# Patient Record
Sex: Male | Born: 1940 | Race: Black or African American | Hispanic: No | Marital: Single | Smoking: Current every day smoker
Health system: Southern US, Community
[De-identification: ages and names within clinical notes are randomized; demographics above are authoritative.]

## PROBLEM LIST (undated history)

## (undated) DIAGNOSIS — C801 Malignant (primary) neoplasm, unspecified: Secondary | ICD-10-CM

## (undated) DIAGNOSIS — K219 Gastro-esophageal reflux disease without esophagitis: Secondary | ICD-10-CM

## (undated) HISTORY — PX: NEPHRECTOMY: SHX65

---

## 2013-11-16 ENCOUNTER — Emergency Department (HOSPITAL_BASED_OUTPATIENT_CLINIC_OR_DEPARTMENT_OTHER)
Admission: EM | Admit: 2013-11-16 | Discharge: 2013-11-17 | Disposition: A | Discharge status: Home / Self Care | Payer: Medicare HMO | Attending: Emergency Medicine | Admitting: Emergency Medicine

## 2013-11-16 ENCOUNTER — Encounter (HOSPITAL_BASED_OUTPATIENT_CLINIC_OR_DEPARTMENT_OTHER): Payer: Self-pay | Admitting: Emergency Medicine

## 2013-11-16 ENCOUNTER — Emergency Department (HOSPITAL_BASED_OUTPATIENT_CLINIC_OR_DEPARTMENT_OTHER): Payer: Medicare HMO

## 2013-11-16 DIAGNOSIS — R630 Anorexia: Secondary | ICD-10-CM | POA: Diagnosis not present

## 2013-11-16 DIAGNOSIS — F17200 Nicotine dependence, unspecified, uncomplicated: Secondary | ICD-10-CM | POA: Diagnosis not present

## 2013-11-16 DIAGNOSIS — R5383 Other fatigue: Principal | ICD-10-CM

## 2013-11-16 DIAGNOSIS — R3129 Other microscopic hematuria: Secondary | ICD-10-CM | POA: Diagnosis not present

## 2013-11-16 DIAGNOSIS — R066 Hiccough: Secondary | ICD-10-CM | POA: Diagnosis not present

## 2013-11-16 DIAGNOSIS — R531 Weakness: Secondary | ICD-10-CM

## 2013-11-16 DIAGNOSIS — R109 Unspecified abdominal pain: Secondary | ICD-10-CM | POA: Diagnosis not present

## 2013-11-16 DIAGNOSIS — R5381 Other malaise: Secondary | ICD-10-CM | POA: Insufficient documentation

## 2013-11-16 DIAGNOSIS — R300 Dysuria: Secondary | ICD-10-CM | POA: Diagnosis not present

## 2013-11-16 LAB — CBC WITH DIFFERENTIAL/PLATELET
Band Neutrophils: 0 % (ref 0–10)
Basophils Absolute: 0 K/uL (ref 0.0–0.1)
Basophils Relative: 0 % (ref 0–1)
Blasts: 0 %
Eosinophils Absolute: 0 K/uL (ref 0.0–0.7)
Eosinophils Relative: 0 % (ref 0–5)
HCT: 36.3 % — ABNORMAL LOW (ref 39.0–52.0)
Hemoglobin: 13.4 g/dL (ref 13.0–17.0)
Lymphocytes Relative: 52 % — ABNORMAL HIGH (ref 12–46)
Lymphs Abs: 1.9 K/uL (ref 0.7–4.0)
MCH: 36.7 pg — ABNORMAL HIGH (ref 26.0–34.0)
MCHC: 36.9 g/dL — ABNORMAL HIGH (ref 30.0–36.0)
MCV: 99.5 fL (ref 78.0–100.0)
Metamyelocytes Relative: 0 %
Monocytes Absolute: 0.2 K/uL (ref 0.1–1.0)
Monocytes Relative: 6 % (ref 3–12)
Myelocytes: 0 %
Neutro Abs: 1.5 K/uL — ABNORMAL LOW (ref 1.7–7.7)
Neutrophils Relative %: 42 % — ABNORMAL LOW (ref 43–77)
Platelets: 126 K/uL — ABNORMAL LOW (ref 150–400)
Promyelocytes Absolute: 0 %
RBC: 3.65 MIL/uL — ABNORMAL LOW (ref 4.22–5.81)
RDW: 14.4 % (ref 11.5–15.5)
WBC: 3.6 K/uL — ABNORMAL LOW (ref 4.0–10.5)
nRBC: 0 /100{WBCs}

## 2013-11-16 LAB — URINALYSIS, ROUTINE W REFLEX MICROSCOPIC
Bilirubin Urine: NEGATIVE
Glucose, UA: NEGATIVE mg/dL
Ketones, ur: NEGATIVE mg/dL
LEUKOCYTES UA: NEGATIVE
Nitrite: NEGATIVE
PROTEIN: 100 mg/dL — AB
Specific Gravity, Urine: 1.009 (ref 1.005–1.030)
UROBILINOGEN UA: 1 mg/dL (ref 0.0–1.0)
pH: 6 (ref 5.0–8.0)

## 2013-11-16 LAB — COMPREHENSIVE METABOLIC PANEL WITH GFR
ALT: 32 U/L (ref 0–53)
AST: 48 U/L — ABNORMAL HIGH (ref 0–37)
Albumin: 2.5 g/dL — ABNORMAL LOW (ref 3.5–5.2)
Alkaline Phosphatase: 60 U/L (ref 39–117)
Anion gap: 20 — ABNORMAL HIGH (ref 5–15)
BUN: 6 mg/dL (ref 6–23)
CO2: 24 meq/L (ref 19–32)
Calcium: 8.5 mg/dL (ref 8.4–10.5)
Chloride: 97 meq/L (ref 96–112)
Creatinine, Ser: 0.9 mg/dL (ref 0.50–1.35)
GFR calc Af Amer: >90 mL/min
GFR calc non Af Amer: 84 mL/min — ABNORMAL LOW
Glucose, Bld: 83 mg/dL (ref 70–99)
Potassium: 3.5 meq/L — ABNORMAL LOW (ref 3.7–5.3)
Sodium: 141 meq/L (ref 137–147)
Total Bilirubin: 0.5 mg/dL (ref 0.3–1.2)
Total Protein: 5.9 g/dL — ABNORMAL LOW (ref 6.0–8.3)

## 2013-11-16 LAB — URINE MICROSCOPIC-ADD ON

## 2013-11-16 LAB — LIPASE, BLOOD: Lipase: 34 U/L (ref 11–59)

## 2013-11-16 MED ORDER — SODIUM CHLORIDE 0.9 % IV BOLUS (SEPSIS)
1000.0000 mL | Freq: Once | INTRAVENOUS | Status: AC
  Administered 2013-11-16: 1000 mL via INTRAVENOUS

## 2013-11-16 MED ORDER — CHLORPROMAZINE HCL 25 MG PO TABS: 25.0000 mg | ORAL_TABLET | Freq: Three times a day (TID) | ORAL | Status: AC

## 2013-11-16 NOTE — ED Notes (Signed)
Pt reports hiccups and "not feeling well" x 3 days.  Reports decreased intake with no appetite.

## 2013-11-16 NOTE — Discharge Instructions (Signed)
°Emergency Department Resource Guide °1) Find a Doctor and Pay Out of Pocket °Although you won't have to find out who is covered by your insurance plan, it is a good idea to ask around and get recommendations. You will then need to call the office and see if the doctor you have chosen will accept you as a new patient and what types of options they offer for patients who are self-pay. Some doctors offer discounts or will set up payment plans for their patients who do not have insurance, but you will need to ask so you aren't surprised when you get to your appointment. ° °2) Contact Your Local Health Department °Not all health departments have doctors that can see patients for sick visits, but many do, so it is worth a call to see if yours does. If you don't know where your local health department is, you can check in your phone book. The CDC also has a tool to help you locate your state's health department, and many state websites also have listings of all of their local health departments. ° °3) Find a Walk-in Clinic °If your illness is not likely to be very severe or complicated, you may want to try a walk in clinic. These are popping up all over the country in pharmacies, drugstores, and shopping centers. They're usually staffed by nurse practitioners or physician assistants that have been trained to treat common illnesses and complaints. They're usually fairly quick and inexpensive. However, if you have serious medical issues or chronic medical problems, these are probably not your best option. ° °No Primary Care Doctor: °- Call [facility] at  832-8000 - they can help you locate a primary care doctor that  accepts your insurance, provides certain services, etc. °- Physician Referral Service- 1-800-533-3463 ° °Chronic Pain Problems: °Organization         Address  Phone   Notes  °[facility]  ([phone]) [phone] Patients need to be referred by their primary care doctor.  ° °Medication  Assistance: °Organization         Address  Phone   Notes  °[place] [facility] [address] °[place], [place] [address] (336) 641-8030 --Must be a resident of [place] °-- Must have NO insurance coverage whatsoever (no Medicaid/ Medicare, etc.) °-- The pt. MUST have a primary care doctor that directs their care regularly and follows them in the community °  °[facility]  (866) 331-1348   °[employer]  (888) 892-1162   ° °Agencies that provide inexpensive medical care: °Organization         Address  Phone   Notes  °[facility]  (336) 832-8035   °[facility]    (336) 832-7272   °[facility] [address] °[place], [place] [address] (336) 832-4777   °[facility] [address], °[place] (336) 271-4999   °[facility]    (336) 373-0678   °[facility]    (336) 272-1050   °[facility] ° [address], [place] Phone:  (336) 832-4444, Fax:  (336) 832-4440 Hours of Operation:  9 am - 6 pm, M-F.  Also accepts Medicaid/Medicare and self-pay.  °[facility] ° [address],  Phone: (336) 832-3150, Fax: (336) [phone]. Hours of Operation:  8:30 am - 5:30 pm, M-F.  Also accepts Medicaid and self-pay.  °[facility] [place] [address], [place] Phone: (336) 878-6027   °[facility] [address], [place], [place] (336)723-1848, Ext. 123 Mondays & Thursdays: 7-9 AM.  First 15 patients are seen on a first come, first serve basis. °  ° °Medicaid-accepting [place] Providers: ° °Organization         Address  Phone   Notes  °[facility] [address], [place] (336) 641-2100 Also accepts self-pay patients.  °[facility] [address], [place] ° (336) 856-9996   °[facility] [address], [place]  (336) 288-8857   °[facility] [address] [place] [address], [place] (336) 299-7000   °Veita Bland [address], [place]  ° (336) 373-1557 Only accepts [place] Access Medicaid patients after they have their name applied to their card.  ° °Self-Pay (no insurance) in [place]: ° °Organization         Address  Phone   Notes  °Sickle Cell Patients, [facility] [address], [place] (336) 832-1970   °[facility] [address], [place] (336) 832-4400   °[facility] ° [address], [place] (336) 992-4800   °[facility]/Dr. Osei-Bonsu ° [address] [place] [address], [place] or [address], [place] (336) [phone] Phone number for both [place] and [place] locations is the same.  °[facility] [address], [place] (336) 299-0000   °[facility] [address] [place] [address], [place] or [address] (336) 852-7530 °(336) 878-2260   °[facility] [address], [place] (336) 350-1642, phone; (336) 294-5005, fax Sees patients 1st and 3rd Saturday of every month.  Must not qualify for public or private insurance (i.e. Medicaid, Medicare, [place] Health Choice, Veterans' Benefits) • Household income should be no more than 200% of the poverty level •The clinic cannot treat you if you are pregnant or think you are pregnant • Sexually transmitted diseases are not treated at the clinic.  ° ° °Dental Care: °Organization         Address  Phone  Notes  °[place] [facility] [address], [place] (336) 641-6152 Accepts children up to age 21 who are enrolled in Medicaid or [place] Health Choice; pregnant women with a Medicaid card; and children who have applied for Medicaid or [place] Health Choice, but were declined, whose parents can pay a reduced fee at time of service.  °[place]  [facility] [place]  [address], [place] (336) 641-7733 Accepts children up to age 21 who are enrolled in Medicaid or [place] Health Choice; pregnant women with a Medicaid card; and children who have applied for Medicaid or  Health Choice, but were declined, whose parents can pay a reduced fee at time of service.  °[facility] ° [address],  (336) 641-4533 Patients are seen by appointment only. Walk-ins are not accepted. [facility] will see patients 18 years of age and older. °Monday - Tuesday (8am-5pm) °Most Wednesdays (8:30-5pm) °$30 per visit, cash only  °[facility] ° [address], [place] (336) 641-4533 Patients are seen by appointment only. Walk-ins are not accepted. [facility] will see patients 18 years of age and older. °One   Wednesday Evening (Monthly: Volunteer Based).  $30 per visit, cash only  °[facility]  (919) [phone] for adults; Children under age 4, call Graduate Pediatric Dentistry at ([phone]) [phone]. Children aged 4-14, please call ([phone]) [phone] to request a pediatric application. ° Dental services are provided in all areas of dental care including fillings, crowns and bridges, complete and partial dentures, implants, gum treatment, root canals, and extractions. Preventive care is also provided. Treatment is provided to both adults and children. °Patients are selected via a lottery and there is often a waiting list. °  °[facility] [address], °[place] ° (336) 763-8833 [email or web address] °  °[facility] [address], [place], [place] (336)723-1848, Ext. 123 Second and Fourth Thursday of each month, opens at 6:30 AM; Clinic ends at 9 AM.  Patients are seen on a first-come first-served basis, and a limited number are seen during each clinic.  ° °[facility] ° [address], [place], [place] (336) [phone]    Eligibility Requirements °You must have lived in [place], [place], or [place] counties for at least the last three months. °  You cannot be eligible for state or federal sponsored healthcare insurance, including Veterans Administration, Medicaid, or Medicare. °  You generally cannot be eligible for healthcare insurance through your employer.  °  How to apply: °Eligibility screenings are held every Tuesday and Wednesday afternoon from 1:00 pm until 4:00 pm. You do not need an appointment for the interview!  °[facility] [address], [place], [place] 336-631-2330   °[facility]  336-342-8273   °[facility]  336-703-3100   °[facility]  336-570-6415   ° °Behavioral Health Resources in the Community: °Intensive Outpatient Programs °Organization         Address  Phone  Notes  °[facility] [address], [place], [place] 336-878-6098   °[facility] [address], [place], [place] 336-832-9800   °[facility] [address], [place], [place] ° 336-882-2125   °[facility] [address],  °[place], [place] 1-800-853-5163 or 336-641-4981   °Substance Abuse Resources °Organization         Address  Phone  Notes  °[facility]  336-882-2125   °[facility]  336-784-9470   °[facility]  336-285-9073   °[facility]  336-845-3988   °Residential & Outpatient Substance Abuse Program  1-800-659-3381   °Psychological Services °Organization         Address  Phone  Notes  °[facility]  336- 832-9600   °[facility]  336- 378-7881   °[facility] [address], [place] 1-800-853-5163 or 336-641-4981   ° °Mobile Crisis Teams °Organization         Address  Phone  Notes  °[facility], Mobile Crisis Care Unit  1-877-626-1772   °Assertive °[facility] ° [address]  [place], [place] 336-834-9664   °Sharon DeEsch [address] °[place] 336-554-5454   ° °Self-Help/Support Groups °Organization         Address  Phone             Notes  °[facility] - variety of support groups  336- 373-1402 Call for more information  °Narcotics Anonymous (NA), [facility] [address], °[place]  2 meetings at this location  ° °  Residential Treatment Programs Organization         Address  Phone  Notes  [facility] [address]    [place]  1-(609)713-4130   [facility]  [address], [place] [address], [place], [place]   [place], [place] 407-800-1302 Admissions: 8am-3pm M-F  [facility] [place] [address]    [place], [place] 086-761-9509   [facility] [address] [place], [place], [place]   [facility] [address],  [place], [place]   [facility] - [address]ensive [address], [place], [place]   [facility] ([place]  [place], [place] 1-(713)040-8947 or (217) 079-0746   [facility] [address] [place], [place] Accepts Medicaid  [facility] [address],  [place] 1-(920) 803-0259 Substance Abuse/Addiction Treatment   [facility]         Address  Phone  Notes  [facility]  940-677-3924   Domenic Schwab, PhD [address] Arlis Porta [place], [place]   301-761-5170 or 825-870-9171   [place] [place], [place] [phone]   [facility] [address] [place], [place] (670)[phone] Insurance/Medicaid/sponsorship through [facility] and Families [address]                                    [place], [place] [phone] [place] [address], [place] ([phone]) [phone]    Dr. Adele Schilder  [phone]   [facility] 1) [address] [place] 2) [place] 3)  [place] [address], [place] [phone]  (240)[phone]   [place] 740-191-7170 or (865) [phone] (After Hours)         Weakness Weakness is a lack of strength. It may be felt all over the body (generalized) or in one specific part of the body (focal). Some causes of weakness can be serious. You may need further medical evaluation, especially if you are elderly or you have a history of immunosuppression (such as chemotherapy or HIV), kidney disease, heart disease, or diabetes. CAUSES  Weakness can be caused by many different things, including:  Infection.  Physical exhaustion.  Internal bleeding or other blood loss that results in a lack of red blood cells (anemia).  Dehydration. This cause is more common in elderly people.  Side effects or electrolyte abnormalities from medicines, such as pain medicines or sedatives.  Emotional distress, anxiety, or depression.  Circulation problems, especially severe peripheral arterial disease.  Heart disease, such as rapid atrial fibrillation, bradycardia, or heart failure.  Nervous system disorders, such as Guillain-Barr syndrome, multiple sclerosis, or stroke. DIAGNOSIS  To find the cause of your weakness, your caregiver will take your history and perform a physical exam. Lab tests or X-rays may also be ordered, if needed. TREATMENT  Treatment of weakness depends on the cause of your symptoms and can vary greatly. HOME CARE INSTRUCTIONS   Rest as needed.  Eat a well-balanced diet.  Try to get some exercise every day.  Only take over-the-counter or prescription  medicines as directed by your caregiver. SEEK MEDICAL CARE IF:   Your weakness seems to be  getting worse or spreads to other parts of your body.  You develop new aches or pains. SEEK IMMEDIATE MEDICAL CARE IF:   You cannot perform your normal daily activities, such as getting dressed and feeding yourself.  You cannot walk up and down stairs, or you feel exhausted when you do so.  You have shortness of breath or chest pain.  You have difficulty moving parts of your body.  You have weakness in only one area of the body or on only one side of the body.  You have a fever.  You have trouble speaking or swallowing.  You cannot control your bladder or bowel movements.  You have black or bloody vomit or stools. MAKE SURE YOU:  Understand these instructions.  Will watch your condition.  Will get help right away if you are not doing well or get worse. Document Released: 02/27/2005 Document Revised: 08/29/2011 Document Reviewed: 04/28/2011 [facility] Patient Information 2015 [place], [place]. This information is not intended to replace advice given to you by your health care provider. Make sure you discuss any questions you have with your health care provider.    Fatigue Fatigue is a feeling of tiredness, lack of energy, lack of motivation, or feeling tired all the time. Having enough rest, good nutrition, and reducing stress will normally reduce fatigue. Consult your caregiver if it persists. The nature of your fatigue will help your caregiver to find out its cause. The treatment is based on the cause.  CAUSES  There are many causes for fatigue. Most of the time, fatigue can be traced to one or more of your habits or routines. Most causes fit into one or more of three general areas. They are: Lifestyle problems  Sleep disturbances.  Overwork.  Physical exertion.  Unhealthy habits.  Poor eating habits or eating disorders.  Alcohol and/or drug use .  Lack of proper nutrition (malnutrition). Psychological problems  Stress and/or anxiety  problems.  Depression.  Grief.  Boredom. Medical Problems or Conditions  Anemia.  Pregnancy.  Thyroid gland problems.  Recovery from major surgery.  Continuous pain.  Emphysema or asthma that is not well controlled  Allergic conditions.  Diabetes.  Infections (such as mononucleosis).  Obesity.  Sleep disorders, such as sleep apnea.  Heart failure or other heart-related problems.  Cancer.  Kidney disease.  Liver disease.  Effects of certain medicines such as antihistamines, cough and cold remedies, prescription pain medicines, heart and blood pressure medicines, drugs used for treatment of cancer, and some antidepressants. SYMPTOMS  The symptoms of fatigue include:   Lack of energy.  Lack of drive (motivation).  Drowsiness.  Feeling of indifference to the surroundings. DIAGNOSIS  The details of how you feel help guide your caregiver in finding out what is causing the fatigue. You will be asked about your present and past health condition. It is important to review all medicines that you take, including prescription and non-prescription items. A thorough exam will be done. You will be questioned about your feelings, habits, and normal lifestyle. Your caregiver may suggest blood tests, urine tests, or other tests to look for common medical causes of fatigue.  TREATMENT  Fatigue is treated by correcting the underlying cause. For example, if you have continuous pain or depression, treating these causes will improve how you feel. Similarly, adjusting the dose of certain medicines will help in reducing fatigue.  HOME CARE INSTRUCTIONS  Try to get the required amount of good sleep every night.  Eat a healthy and nutritious diet, and drink enough water throughout the day.  Practice ways of relaxing (including yoga or meditation).  Exercise regularly.  Make plans to change situations that cause stress. Act on those plans so that stresses decrease over time. Keep  your work and personal routine reasonable.  Avoid street drugs and minimize use of alcohol.  Start taking a daily multivitamin after consulting your caregiver. SEEK MEDICAL CARE IF:   You have persistent tiredness, which cannot be accounted for.  You have fever.  You have unintentional weight loss.  You have headaches.  You have disturbed sleep throughout the night.  You are feeling sad.  You have constipation.  You have dry skin.  You have gained weight.  You are taking any new or different medicines that you suspect are causing fatigue.  You are unable to sleep at night.  You develop any unusual swelling of your legs or other parts of your body. SEEK IMMEDIATE MEDICAL CARE IF:   You are feeling confused.  Your vision is blurred.  You feel faint or pass out.  You develop severe headache.  You develop severe abdominal, pelvic, or back pain.  You develop chest pain, shortness of breath, or an irregular or fast heartbeat.  You are unable to pass a normal amount of urine.  You develop abnormal bleeding such as bleeding from the rectum or you vomit blood.  You have thoughts about harming yourself or committing suicide.  You are worried that you might harm someone else. MAKE SURE YOU:   Understand these instructions.  Will watch your condition.  Will get help right away if you are not doing well or get worse. Document Released: 12/25/2006 Document Revised: 05/22/2011 Document Reviewed: 07/01/2013 [facility] Patient Information 2015 [place], [place]. This information is not intended to replace advice given to you by your health care provider. Make sure you discuss any questions you have with your health care provider.   Hematuria Hematuria is blood in your urine. It can be caused by a bladder infection, kidney infection, prostate infection, kidney stone, or cancer of your urinary tract. Infections can usually be treated with medicine, and a kidney stone usually  will pass through your urine. If neither of these is the cause of your hematuria, further workup to find out the reason may be needed. It is very important that you tell your health care provider about any blood you see in your urine, even if the blood stops without treatment or happens without causing pain. Blood in your urine that happens and then stops and then happens again can be a symptom of a very serious condition. Also, pain is not a symptom in the initial stages of many urinary cancers. HOME CARE INSTRUCTIONS   Drink lots of fluid, 3-4 quarts a day. If you have been diagnosed with an infection, cranberry juice is especially recommended, in addition to large amounts of water.  Avoid caffeine, tea, and carbonated beverages because they tend to irritate the bladder.  Avoid alcohol because it may irritate the prostate.  Take all medicines as directed by your health care provider.  If you were prescribed an antibiotic medicine, finish it all even if you start to feel better.  If you have been diagnosed with a kidney stone, follow your health care provider's instructions regarding straining your urine to catch the stone.  Empty your bladder often. Avoid holding urine for long periods  of time.  After a bowel movement, women should cleanse front to back. Use each tissue only once.  Empty your bladder before and after sexual intercourse if you are a male. SEEK MEDICAL CARE IF:  You develop back pain.  You have a fever.  You have a feeling of sickness in your stomach (nausea) or vomiting.  Your symptoms are not better in 3 days. Return sooner if you are getting worse. SEEK IMMEDIATE MEDICAL CARE IF:   You develop severe vomiting and are unable to keep the medicine down.  You develop severe back or abdominal pain despite taking your medicines.  You begin passing a large amount of blood or clots in your urine.  You feel extremely weak or faint, or you pass out. MAKE SURE YOU:    Understand these instructions.  Will watch your condition.  Will get help right away if you are not doing well or get worse. Document Released: 02/27/2005 Document Revised: 07/14/2013 Document Reviewed: 10/28/2012 [facility] Patient Information 2015 [place], [place]. This information is not intended to replace advice given to you by your health care provider. Make sure you discuss any questions you have with your health care provider.    Hiccups A hiccup is the result of a sudden shortening of the muscle below your lungs (diaphragm). This movement of your diaphragm is then followed by the closing of your vocal cords, which causes the hiccup sound. Most people get the hiccups. Typically, hiccups last only a short amount of time. There are three types of hiccups:   Benign: last less than 48 hours.  Persistent: last more than 48 hours, but less than 1 month.  Intractable: last more than 1 month. A hiccup is a reflex. You cannot control reflexes. CAUSES  Causes of the hiccups can include:   Eating too much.  Drinking too much alcohol or fizzy drinks.  Eating too fast.  Eating or drinking hot and spicy foods or drinks.  Using certain medicines that have hiccupping as a side effect. Several medical conditions may also cause hiccups, including, but not limited to:  Stroke.  Gastroesophageal reflux.  Multiple sclerosis.  Traumatic brain injury.  Brain tumor.  Meningitis.  Having damage to the nerve that affects the diaphragm. Usually, though, hiccups have no apparent cause and are not the result of a serious medical condition. DIAGNOSIS  Tests may be performed to diagnose a possible condition associated with persistent or intractable hiccups. TREATMENT  Most cases of the hiccups need no treatment. None of the numerous home remedies have been proven to be effective. If your hiccups do require treatment, your treatment may include:  Medicine. Medicine may be given  intravenously (by IV) or by mouth.  Hypnosis or acupuncture.  Surgery to the nerve that affects the diaphragm may be tried in severe cases. If your hiccups are caused by an underlying medical condition, treatment for the medical condition may be necessary.  HOME CARE INSTRUCTIONS   Eat small meals.  Limit alcohol intake to no more than 1 drink per day for nonpregnant women and 2 drinks per day for men. One drink equals 12 ounces of beer, 5 ounces of wine, or 1 ounces of hard liquor.  Limit drinking fizzy drinks.  Eat and chew your food slowly.  Take medicines only as directed by your health care provider. SEEK MEDICAL CARE IF:   Your hiccups last for more than 48 hours.  You are given medicine, but your hiccups do not get better.  You  cannot sleep or eat due to the hiccups.  You have unexpected weight loss due to the hiccups.  You have trouble breathing or swallowing.  You have a fever.  You develop severe pain in your abdomen.  You develop numbness, tingling, or weakness. Document Released: 05/08/2001 Document Revised: 07/14/2013 Document Reviewed: 04/20/2010 [facility] Patient Information 2015 [place], [place]. This information is not intended to replace advice given to you by your health care provider. Make sure you discuss any questions you have with your health care provider.

## 2013-11-16 NOTE — ED Provider Notes (Signed)
CSN: [id number]     Arrival date & time 11/16/13  1943 History  This chart was scribed for Ephraim Hamburger, MD by Cathie Hoops, ED Scribe. The patient was seen in [place]. The patient's care was started at 9:15 PM.     Chief Complaint  Patient presents with  . Fatigue   The history is provided by the patient. No language interpreter was used.   HPI Comments: Ricardo Harris is a 73 y.o. male who presents to the Emergency Department complaining of new, constant, gradually worsening fatigue. Pt has associated hiccups, lower abdominal pain, dysuria and vomiting. Pt states his appetite has decreased for the past 2-3 days. Pt has had abdominal pain for 3 days. Pt has thrown up 2x today. His dysuria has occurred for 2-3 weeks. Pt denies he has seen anyone for his dysuria. Pt notes his abdominal pain occurs when his urinates. Pt has tried drinking water and holding his breathe without relief. Pt denies gas, back pain, chest pain, diarrhea, cough, SOB, penile pain, testicular pain and hematuria.  History reviewed. No pertinent past medical history. History reviewed. No pertinent past surgical history. No family history on file. History  Substance Use Topics  . Smoking status: Current Every Day Smoker -- 1.00 packs/day    Types: Cigarettes  . Smokeless tobacco: Not on file  . Alcohol Use: Yes    Review of Systems  Constitutional: Positive for appetite change and fatigue.  Respiratory: Negative for cough and shortness of breath.   Gastrointestinal: Positive for abdominal pain. Negative for diarrhea and blood in stool.  Genitourinary: Positive for dysuria. Negative for hematuria, penile pain and testicular pain.  Musculoskeletal: Negative for back pain.  All other systems reviewed and are negative.  Allergies  Review of patient's allergies indicates no known allergies.  Home Medications   Prior to Admission medications   Not on File   Triage Vitals: BP 128/76  Pulse 88  Temp(Src) 98.8  F (37.1 C) (Oral)  Resp 18  Ht 5\' 4"  (1.626 m)  Wt 130 lb (58.968 kg)  BMI 22.30 kg/m2  SpO2 97% Physical Exam  Nursing note and vitals reviewed. Constitutional: He is oriented to person, place, and time. He appears well-developed and well-nourished. No distress.  Intermittent hiccupping.  HENT:  Head: Normocephalic and atraumatic.  Right Ear: External ear normal.  Left Ear: External ear normal.  Nose: Nose normal.  Mouth/Throat: Oropharynx is clear and moist.  Neck: Neck supple. No tracheal deviation present.  Cardiovascular: Normal rate, regular rhythm and normal heart sounds.   Pulmonary/Chest: Effort normal and breath sounds normal. No respiratory distress.  Abdominal: Soft. He exhibits no distension. There is no tenderness.  No CVA tenderness  Neurological: He is alert and oriented to person, place, and time.  Skin: Skin is warm and dry. No pallor.  Psychiatric: He has a normal mood and affect. His behavior is normal.    ED Course  Procedures (including critical care time) DIAGNOSTIC STUDIES: Oxygen Saturation is 97% on RA, adqueate by my interpretation.    COORDINATION OF CARE: 9:21 PM- Patient informed of current plan for treatment and evaluation and agrees with plan at this time.    Labs Review Labs Reviewed  URINALYSIS, ROUTINE W REFLEX MICROSCOPIC - Abnormal; Notable for the following:    Color, Urine AMBER (*)    APPearance CLOUDY (*)    Hgb urine dipstick LARGE (*)    Protein, ur 100 (*)    All other components within normal  limits  CBC WITH DIFFERENTIAL - Abnormal; Notable for the following:    WBC 3.6 (*)    RBC 3.65 (*)    HCT 36.3 (*)    MCH 36.7 (*)    MCHC 36.9 (*)    Platelets 126 (*)    Neutrophils Relative % 42 (*)    Lymphocytes Relative 52 (*)    Neutro Abs 1.5 (*)    All other components within normal limits  COMPREHENSIVE METABOLIC PANEL - Abnormal; Notable for the following:    Potassium 3.5 (*)    Total Protein 5.9 (*)    Albumin  2.5 (*)    AST 48 (*)    GFR calc non Af Amer 84 (*)    Anion gap 20 (*)    All other components within normal limits  URINE MICROSCOPIC-ADD ON  LIPASE, BLOOD    Imaging Review Dg Chest 2 View 11/16/2013   CLINICAL DATA:  Worsening fatigue. Lower abdominal pain, dysuria, and vomiting. Decreased appetite. Vomiting.  EXAM: CHEST  2 VIEW  COMPARISON:  11/05/2013  FINDINGS: Normal heart size and pulmonary vascularity. Slight interstitial fibrosis. No focal airspace disease or consolidation in the lungs. No blunting of costophrenic angles. No pneumothorax.  IMPRESSION: No active cardiopulmonary disease.   Electronically Signed   By: Lucienne Capers M.D.   On: 11/16/2013 21:45    EKG Interpretation Date/Time:  Sunday November 16 2013 21:54:51 EDT Ventricular Rate:  82 PR Interval:    QRS Duration: 70 QT Interval:  366 QTC Calculation: 427 R Axis:   -21 Text Interpretation:  uncertain rhythm given artifact, rhythm appears  regular no ST/T changes No old tracing to compare Confirmed by [place] ([id number]) on 11/16/2013 9:55:29 PM      MDM   Final diagnoses:  Weakness  Microscopic hematuria  Intractable hiccups    Patient appears well currently, has no abdominal tenderness or distention. Has dysuria but no UTI. Has microscopic hematuria that appears new. Workup is otherwise unremarkable for cause of fatigue. Given he has no current abd pain or tenderness I feel imaging is not needed. Will recommend close f/u with PCP. Will give urology f/u for micrscopic hematuria. Given thorazine for hiccups.  This chart was scribed in my presence and reviewed by me personally.   Ephraim Hamburger, MD 11/17/13 [phone]

## 2014-01-26 ENCOUNTER — Emergency Department (HOSPITAL_BASED_OUTPATIENT_CLINIC_OR_DEPARTMENT_OTHER): Payer: Medicare HMO

## 2014-01-26 ENCOUNTER — Encounter (HOSPITAL_BASED_OUTPATIENT_CLINIC_OR_DEPARTMENT_OTHER): Payer: Self-pay

## 2014-01-26 ENCOUNTER — Emergency Department (HOSPITAL_BASED_OUTPATIENT_CLINIC_OR_DEPARTMENT_OTHER)
Admission: EM | Admit: 2014-01-26 | Discharge: 2014-01-26 | Disposition: A | Discharge status: Home / Self Care | Payer: Medicare HMO | Attending: Emergency Medicine | Admitting: Emergency Medicine

## 2014-01-26 DIAGNOSIS — Z79899 Other long term (current) drug therapy: Secondary | ICD-10-CM | POA: Insufficient documentation

## 2014-01-26 DIAGNOSIS — Z792 Long term (current) use of antibiotics: Secondary | ICD-10-CM | POA: Diagnosis not present

## 2014-01-26 DIAGNOSIS — R1012 Left upper quadrant pain: Secondary | ICD-10-CM | POA: Insufficient documentation

## 2014-01-26 DIAGNOSIS — R1013 Epigastric pain: Secondary | ICD-10-CM | POA: Insufficient documentation

## 2014-01-26 DIAGNOSIS — R109 Unspecified abdominal pain: Secondary | ICD-10-CM

## 2014-01-26 DIAGNOSIS — R748 Abnormal levels of other serum enzymes: Secondary | ICD-10-CM | POA: Diagnosis not present

## 2014-01-26 DIAGNOSIS — Z720 Tobacco use: Secondary | ICD-10-CM | POA: Diagnosis not present

## 2014-01-26 DIAGNOSIS — Z905 Acquired absence of kidney: Secondary | ICD-10-CM | POA: Insufficient documentation

## 2014-01-26 DIAGNOSIS — K5900 Constipation, unspecified: Secondary | ICD-10-CM

## 2014-01-26 LAB — URINALYSIS, ROUTINE W REFLEX MICROSCOPIC
BILIRUBIN URINE: NEGATIVE
GLUCOSE, UA: NEGATIVE mg/dL
HGB URINE DIPSTICK: NEGATIVE
KETONES UR: NEGATIVE mg/dL
Leukocytes, UA: NEGATIVE
Nitrite: NEGATIVE
PROTEIN: 30 mg/dL — AB
Specific Gravity, Urine: 1.016 (ref 1.005–1.030)
Urobilinogen, UA: 0.2 mg/dL (ref 0.0–1.0)
pH: 6 (ref 5.0–8.0)

## 2014-01-26 LAB — COMPREHENSIVE METABOLIC PANEL
ALK PHOS: 73 U/L (ref 39–117)
ALT: 8 U/L (ref 0–53)
ANION GAP: 14 (ref 5–15)
AST: 15 U/L (ref 0–37)
Albumin: 2.8 g/dL — ABNORMAL LOW (ref 3.5–5.2)
BUN: 14 mg/dL (ref 6–23)
CHLORIDE: 98 meq/L (ref 96–112)
CO2: 26 mEq/L (ref 19–32)
Calcium: 9.6 mg/dL (ref 8.4–10.5)
Creatinine, Ser: 1.5 mg/dL — ABNORMAL HIGH (ref 0.50–1.35)
GFR, EST AFRICAN AMERICAN: 52 mL/min — AB (ref >90)
GFR, EST NON AFRICAN AMERICAN: 45 mL/min — AB (ref >90)
GLUCOSE: 146 mg/dL — AB (ref 70–99)
Potassium: 4.5 mEq/L (ref 3.7–5.3)
Sodium: 138 mEq/L (ref 137–147)
Total Bilirubin: 0.3 mg/dL (ref 0.3–1.2)
Total Protein: 7 g/dL (ref 6.0–8.3)

## 2014-01-26 LAB — CBC WITH DIFFERENTIAL/PLATELET
Basophils Absolute: 0 10*3/uL (ref 0.0–0.1)
Basophils Relative: 0 % (ref 0–1)
EOS PCT: 1 % (ref 0–5)
Eosinophils Absolute: 0.1 10*3/uL (ref 0.0–0.7)
HEMATOCRIT: 34.9 % — AB (ref 39.0–52.0)
Hemoglobin: 12.1 g/dL — ABNORMAL LOW (ref 13.0–17.0)
LYMPHS ABS: 2.3 10*3/uL (ref 0.7–4.0)
Lymphocytes Relative: 23 % (ref 12–46)
MCH: 32.9 pg (ref 26.0–34.0)
MCHC: 34.7 g/dL (ref 30.0–36.0)
MCV: 94.8 fL (ref 78.0–100.0)
MONO ABS: 1 10*3/uL (ref 0.1–1.0)
MONOS PCT: 10 % (ref 3–12)
Neutro Abs: 6.5 10*3/uL (ref 1.7–7.7)
Neutrophils Relative %: 66 % (ref 43–77)
Platelets: 354 10*3/uL (ref 150–400)
RBC: 3.68 MIL/uL — AB (ref 4.22–5.81)
RDW: 13 % (ref 11.5–15.5)
WBC: 9.9 10*3/uL (ref 4.0–10.5)

## 2014-01-26 LAB — URINE MICROSCOPIC-ADD ON

## 2014-01-26 LAB — LIPASE, BLOOD: Lipase: 93 U/L — ABNORMAL HIGH (ref 11–59)

## 2014-01-26 MED ORDER — IOHEXOL 300 MG/ML  SOLN
50.0000 mL | Freq: Once | INTRAMUSCULAR | Status: AC | PRN
  Administered 2014-01-26: 50 mL via ORAL

## 2014-01-26 MED ORDER — DICYCLOMINE HCL 20 MG PO TABS: 20.0000 mg | ORAL_TABLET | Freq: Two times a day (BID) | ORAL | Status: AC | PRN

## 2014-01-26 MED ORDER — DICYCLOMINE HCL 10 MG PO CAPS
10.0000 mg | ORAL_CAPSULE | Freq: Once | ORAL | Status: AC
  Administered 2014-01-26: 10 mg via ORAL
  Filled 2014-01-26: qty 1

## 2014-01-26 MED ORDER — POLYETHYLENE GLYCOL 3350 17 G PO PACK: 17.0000 g | PACK | Freq: Every day | ORAL | Status: AC

## 2014-01-26 NOTE — ED Notes (Signed)
Returned from CT scan.

## 2014-01-26 NOTE — ED Notes (Signed)
Pt reports diffuse abdominal pain since yesterday afternoon.  Reports it feels like gas.  Pt just had right kidney removed on nov 4th at [facility].  Pt unsure if pain is from that.  Pt reports urinating normal.  Reports last BM was last Monday.

## 2014-01-26 NOTE — ED Notes (Signed)
Transport to CT

## 2014-01-26 NOTE — ED Provider Notes (Signed)
CSN: [id number]     Arrival date & time 01/26/14  0102 History   First MD Initiated Contact with Patient 01/26/14 0111     Chief Complaint  Patient presents with  . 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 He is not have a bowel movement since. Uncomplicated recovery. 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 Patient's had no nausea, vomiting. He's had no fever or chills. Abdominal pain began around 2 PM this afternoon has been constant since. Admits to feeling abdominal distention. His been taking hydrocodone since surgery. Denies any urinary symptoms. History reviewed. No pertinent past medical history. Past Surgical History  Procedure Laterality Date  . Nephrectomy Right    No family history on file. History  Substance Use Topics  . Smoking status: Current Every Day Smoker -- 1.00 packs/day    Types: Cigarettes  . Smokeless tobacco: Not on file  . Alcohol Use: Yes    Review of Systems  Constitutional: Negative for fever and chills.  Respiratory: Negative for cough and shortness of breath.   Cardiovascular: Negative for chest pain.  Gastrointestinal: Positive for abdominal pain, constipation and abdominal distention. Negative for nausea, vomiting, diarrhea and blood in stool.  Genitourinary: Negative for dysuria, frequency, hematuria, flank pain and difficulty urinating.  Musculoskeletal: Negative for myalgias, back pain, neck pain and neck stiffness.  Skin: Negative for rash and wound.  Neurological: Negative for dizziness, weakness, light-headedness, numbness and headaches.  All other systems reviewed and are negative.     Allergies  Review of patient's allergies indicates no known allergies.  Home Medications   Prior to Admission medications   Medication Sig Start Date End Date Taking? Authorizing Provider  ciprofloxacin (CIPRO) 500 MG tablet Take 500 mg by  mouth 2 (two) times daily.   Yes Historical Provider, MD  HYDROcodone-acetaminophen (NORCO/VICODIN) 5-325 MG per tablet Take 1 tablet by mouth every 6 (six) hours as needed for moderate pain.   Yes Historical Provider, MD  chlorproMAZINE (THORAZINE) 25 MG tablet Take 1 tablet (25 mg total) by mouth 3 (three) times daily. 11/16/13   Ephraim Hamburger, MD   BP 163/85 mmHg  Pulse 87  Temp(Src) 98.6 F (37 C) (Oral)  Resp 20  Ht 5\' 4"  (1.626 m)  Wt 140 lb (63.504 kg)  BMI 24.02 kg/m2  SpO2 95% Physical Exam  Constitutional: He is oriented to person, place, and time. He appears well-developed and well-nourished. No distress.  HENT:  Head: Normocephalic and atraumatic.  Mouth/Throat: Oropharynx is clear and moist.  Eyes: EOM are normal. Pupils are equal, round, and reactive to light.  Neck: Normal range of motion. Neck supple.  Cardiovascular: Normal rate and regular rhythm.   Pulmonary/Chest: Effort normal and breath sounds normal. No respiratory distress. He has no wheezes. He has no rales.  Abdominal: Soft. Bowel sounds are normal. He exhibits no distension and no mass. There is tenderness. There is no rebound and no guarding.  Transverse right abdominal surgical wound that appears to be well healing without any evidence of infection. Patient has diffuse abdominal tenderness especially in the epigastric and left upper quadrants. No definite rebound or guarding.  Musculoskeletal: Normal range of motion. He exhibits no edema or tenderness.  Neurological: He is alert and oriented to person, place, and time.  Skin: Skin is warm and dry. No rash noted. No erythema.  Psychiatric: He has a normal mood and affect. His behavior is normal.  Nursing note and  vitals reviewed.   ED Course  Procedures (including critical care time) Labs Review Labs Reviewed  CBC WITH DIFFERENTIAL - Abnormal; Notable for the following:    RBC 3.68 (*)    Hemoglobin 12.1 (*)    HCT 34.9 (*)    All other components  within normal limits  COMPREHENSIVE METABOLIC PANEL - Abnormal; Notable for the following:    Glucose, Bld 146 (*)    Creatinine, Ser 1.50 (*)    Albumin 2.8 (*)    GFR calc non Af Amer 45 (*)    GFR calc Af Amer 52 (*)    All other components within normal limits  LIPASE, BLOOD - Abnormal; Notable for the following:    Lipase 93 (*)    All other components within normal limits  URINALYSIS, ROUTINE W REFLEX MICROSCOPIC - Abnormal; Notable for the following:    Protein, ur 30 (*)    All other components within normal limits  URINE MICROSCOPIC-ADD ON    Imaging Review Ct Abdomen Pelvis Wo Contrast  01/26/2014   CLINICAL DATA:  Diffuse abdominal pain since yesterday. Right kidney was removed on November 4. Normal urination.  EXAM: CT ABDOMEN AND PELVIS WITHOUT CONTRAST  TECHNIQUE: Multidetector CT imaging of the abdomen and pelvis was performed following the standard protocol without IV contrast.  COMPARISON:  None.  FINDINGS: Atelectasis or infiltration in the lung bases. Small right pleural effusion.  Small amount of free air in the abdomen may be residual from surgery. No free fluid. The unenhanced appearance of the liver, spleen, gallbladder, pancreas, adrenal glands, left kidney, abdominal aorta, inferior vena cava, and retroperitoneal lymph nodes is unremarkable. Surgical absence of the right kidney. Small amount of infiltration in the right upper quadrant consistent with postoperative changes. Stomach, small bowel, and colon are not abnormally distended. Stool scattered throughout the colon. Infiltration in the anterior abdominal wall consistent with recent surgery.  Pelvis: Bladder wall is mildly thickened which could indicate infection. Prostate gland is not enlarged. No pelvic mass or lymphadenopathy. Appendix is normal. No diverticulitis. No free or loculated pelvic fluid collections. Degenerative changes in the spine. No destructive bone lesions appreciated.  IMPRESSION: Small amount of  free air in the abdomen with infiltration in the right upper quadrant posteriorly consistent with recent right nephrectomy. Atelectasis in the right lung base with small effusion. This is likely reactive. Bladder wall is mildly thickened, possibly due to infection.   Electronically Signed   By: Lucienne Capers M.D.   On: 01/26/2014 03:54     EKG Interpretation None      MDM   Final diagnoses:  Abdominal pain    CT with evidence of constipation and recent nephrectomy. Normal white blood cell count. Mild elevation in lipase of uncertain significance. Patient with normal vital signs abdominal exam remains benign. We'll start on Bentyl and laxative. Patient is to follow-up with his surgeon. Return precautions given.    Julianne Rice, MD 01/26/14 [phone]

## 2014-01-26 NOTE — Discharge Instructions (Signed)
Abdominal Pain °Many things can cause abdominal pain. Usually, abdominal pain is not caused by a disease and will improve without treatment. It can often be observed and treated at home. Your health care provider will do a physical exam and possibly order blood tests and X-rays to help determine the seriousness of your pain. However, in many cases, more time must pass before a clear cause of the pain can be found. Before that point, your health care provider may not know if you need more testing or further treatment. °HOME CARE INSTRUCTIONS  °Monitor your abdominal pain for any changes. The following actions may help to alleviate any discomfort you are experiencing: °· Only take over-the-counter or prescription medicines as directed by your health care provider. °· Do not take laxatives unless directed to do so by your health care provider. °· Try a clear liquid diet (broth, tea, or water) as directed by your health care provider. Slowly move to a bland diet as tolerated. °SEEK MEDICAL CARE IF: °· You have unexplained abdominal pain. °· You have abdominal pain associated with nausea or diarrhea. °· You have pain when you urinate or have a bowel movement. °· You experience abdominal pain that wakes you in the night. °· You have abdominal pain that is worsened or improved by eating food. °· You have abdominal pain that is worsened with eating fatty foods. °· You have a fever. °SEEK IMMEDIATE MEDICAL CARE IF:  °· Your pain does not go away within 2 hours. °· You keep throwing up (vomiting). °· Your pain is felt only in portions of the abdomen, such as the right side or the left lower portion of the abdomen. °· You pass bloody or black tarry stools. °MAKE SURE YOU: °· Understand these instructions.   °· Will watch your condition.   °· Will get help right away if you are not doing well or get worse.   °Document Released: 12/07/2004 Document Revised: 03/04/2013 Document Reviewed: 11/06/2012 °[employer]® Patient Information  ©2015 [employer]. This information is not intended to replace advice given to you by your health care provider. Make sure you discuss any questions you have with your health care provider. ° °Constipation °Constipation is when a person has fewer than three bowel movements a week, has difficulty having a bowel movement, or has stools that are dry, hard, or larger than normal. As people grow older, constipation is more common. If you try to fix constipation with medicines that make you have a bowel movement (laxatives), the problem may get worse. Long-term laxative use may cause the muscles of the colon to become weak. A low-fiber diet, not taking in enough fluids, and taking certain medicines may make constipation worse.  °CAUSES  °· Certain medicines, such as antidepressants, pain medicine, iron supplements, antacids, and water pills.   °· Certain diseases, such as diabetes, irritable bowel syndrome (IBS), thyroid disease, or depression.   °· Not drinking enough water.   °· Not eating enough fiber-rich foods.   °· Stress or travel.   °· Lack of physical activity or exercise.   °· Ignoring the urge to have a bowel movement.   °· Using laxatives too much.   °SIGNS AND SYMPTOMS  °· Having fewer than three bowel movements a week.   °· Straining to have a bowel movement.   °· Having stools that are hard, dry, or larger than normal.   °· Feeling full or bloated.   °· Pain in the lower abdomen.   °· Not feeling relief after having a bowel movement.   °DIAGNOSIS  °Your health care provider will take   a medical history and perform a physical exam. Further testing may be done for severe constipation. Some tests may include: °· A barium enema X-ray to examine your rectum, colon, and, sometimes, your small intestine.   °· A sigmoidoscopy to examine your lower colon.   °· A colonoscopy to examine your entire colon. °TREATMENT  °Treatment will depend on the severity of your constipation and what is causing it. Some dietary  treatments include drinking more fluids and eating more fiber-rich foods. Lifestyle treatments may include regular exercise. If these diet and lifestyle recommendations do not help, your health care provider may recommend taking over-the-counter laxative medicines to help you have bowel movements. Prescription medicines may be prescribed if over-the-counter medicines do not work.  °HOME CARE INSTRUCTIONS  °· Eat foods that have a lot of fiber, such as fruits, vegetables, whole grains, and beans. °· Limit foods high in fat and processed sugars, such as french fries, hamburgers, cookies, candies, and soda.   °· A fiber supplement may be added to your diet if you cannot get enough fiber from foods.   °· Drink enough fluids to keep your urine clear or pale yellow.   °· Exercise regularly or as directed by your health care provider.   °· Go to the restroom when you have the urge to go. Do not hold it.   °· Only take over-the-counter or prescription medicines as directed by your health care provider. Do not take other medicines for constipation without talking to your health care provider first.   °SEEK IMMEDIATE MEDICAL CARE IF:  °· You have bright red blood in your stool.   °· Your constipation lasts for more than 4 days or gets worse.   °· You have abdominal or rectal pain.   °· You have thin, pencil-like stools.   °· You have unexplained weight loss. °MAKE SURE YOU:  °· Understand these instructions. °· Will watch your condition. °· Will get help right away if you are not doing well or get worse. °Document Released: 11/26/2003 Document Revised: 03/04/2013 Document Reviewed: 12/09/2012 °[employer]® Patient Information ©2015 [employer]. This information is not intended to replace advice given to you by your health care provider. Make sure you discuss any questions you have with your health care provider. ° °

## 2015-08-01 IMAGING — CR DG CHEST 2V
2 series · 2 of 2 positions shown · non-contrast
Comparison: 11/05/2013

CLINICAL DATA: Worsening fatigue. Lower abdominal pain, dysuria,
and vomiting. Decreased appetite. Vomiting.

EXAM:
CHEST  2 VIEW

[w chest pa]
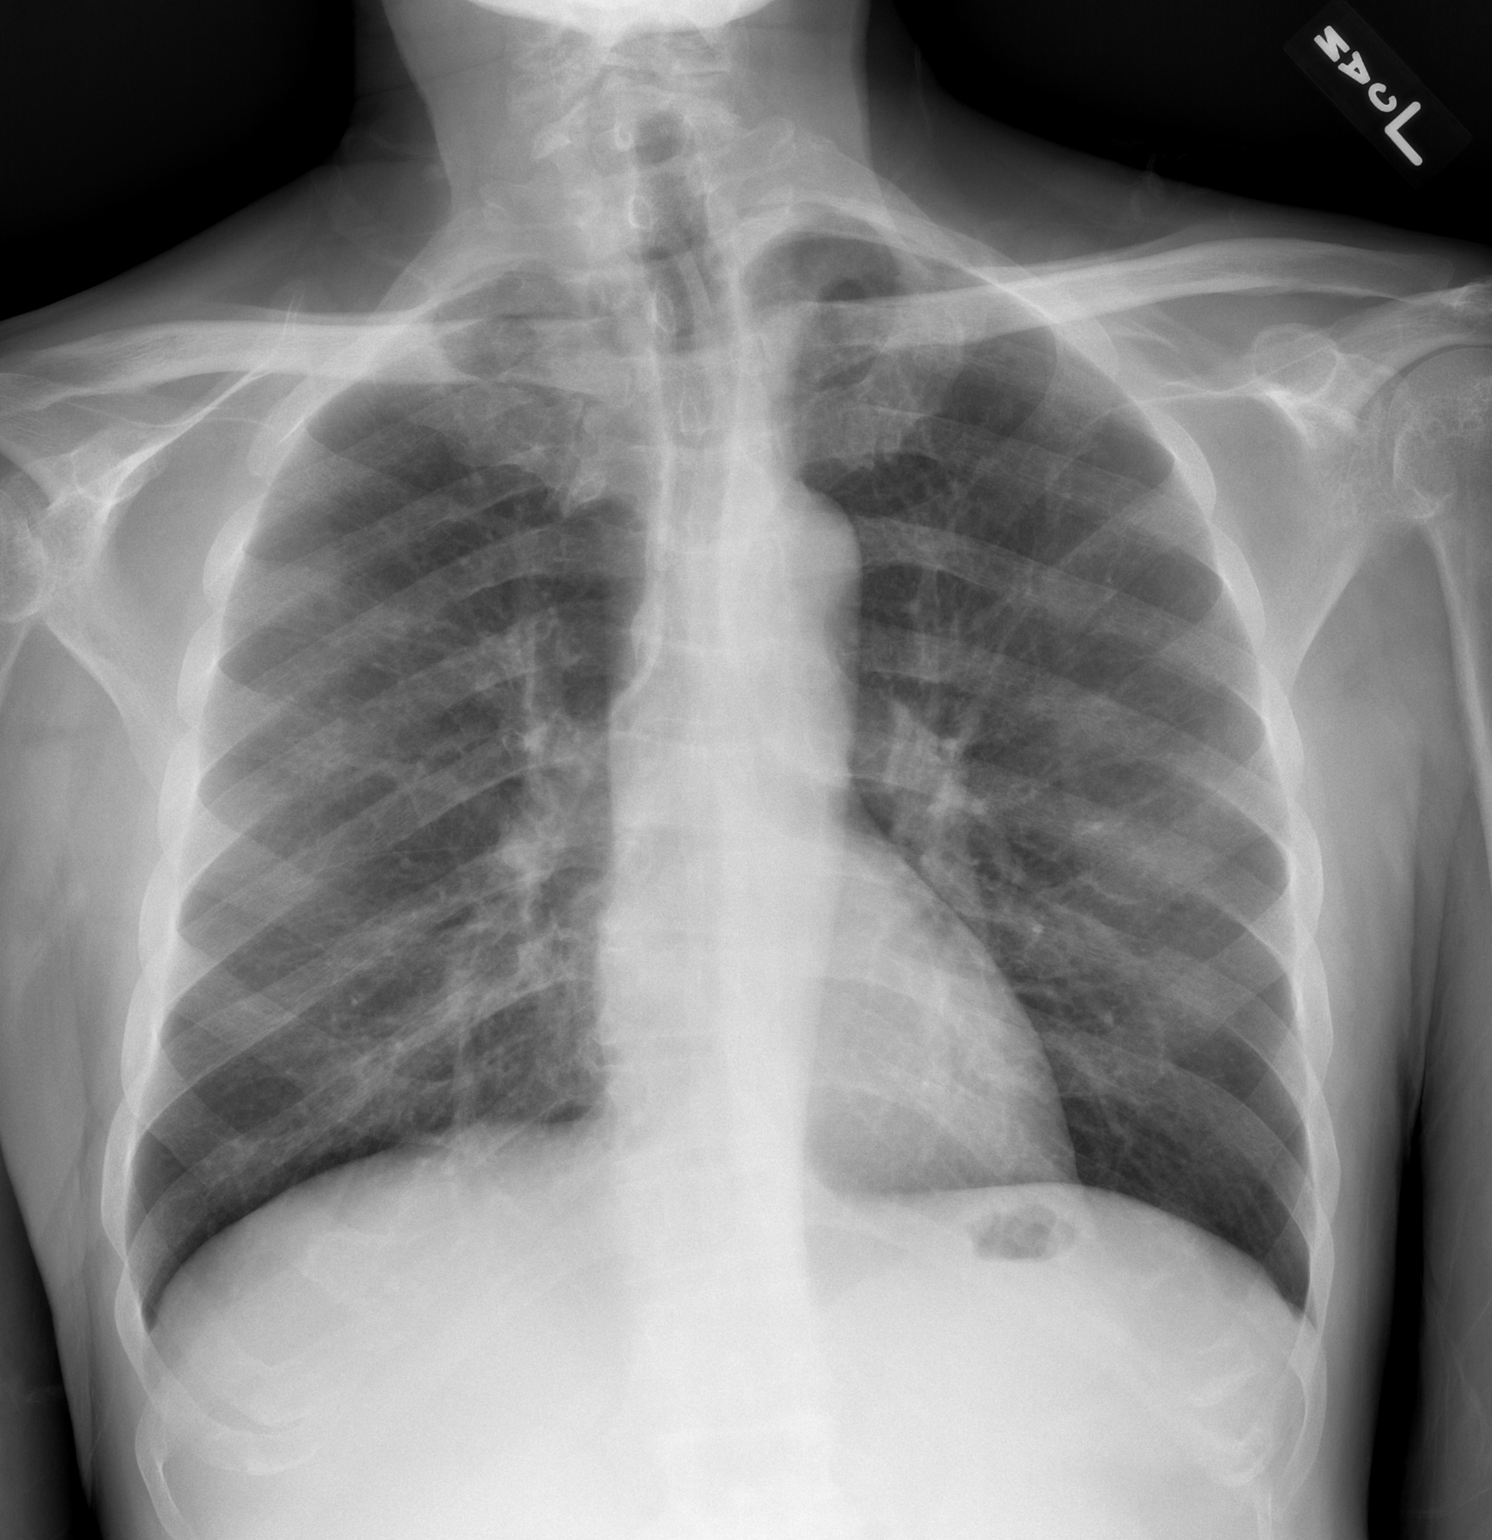

[w chest lat]
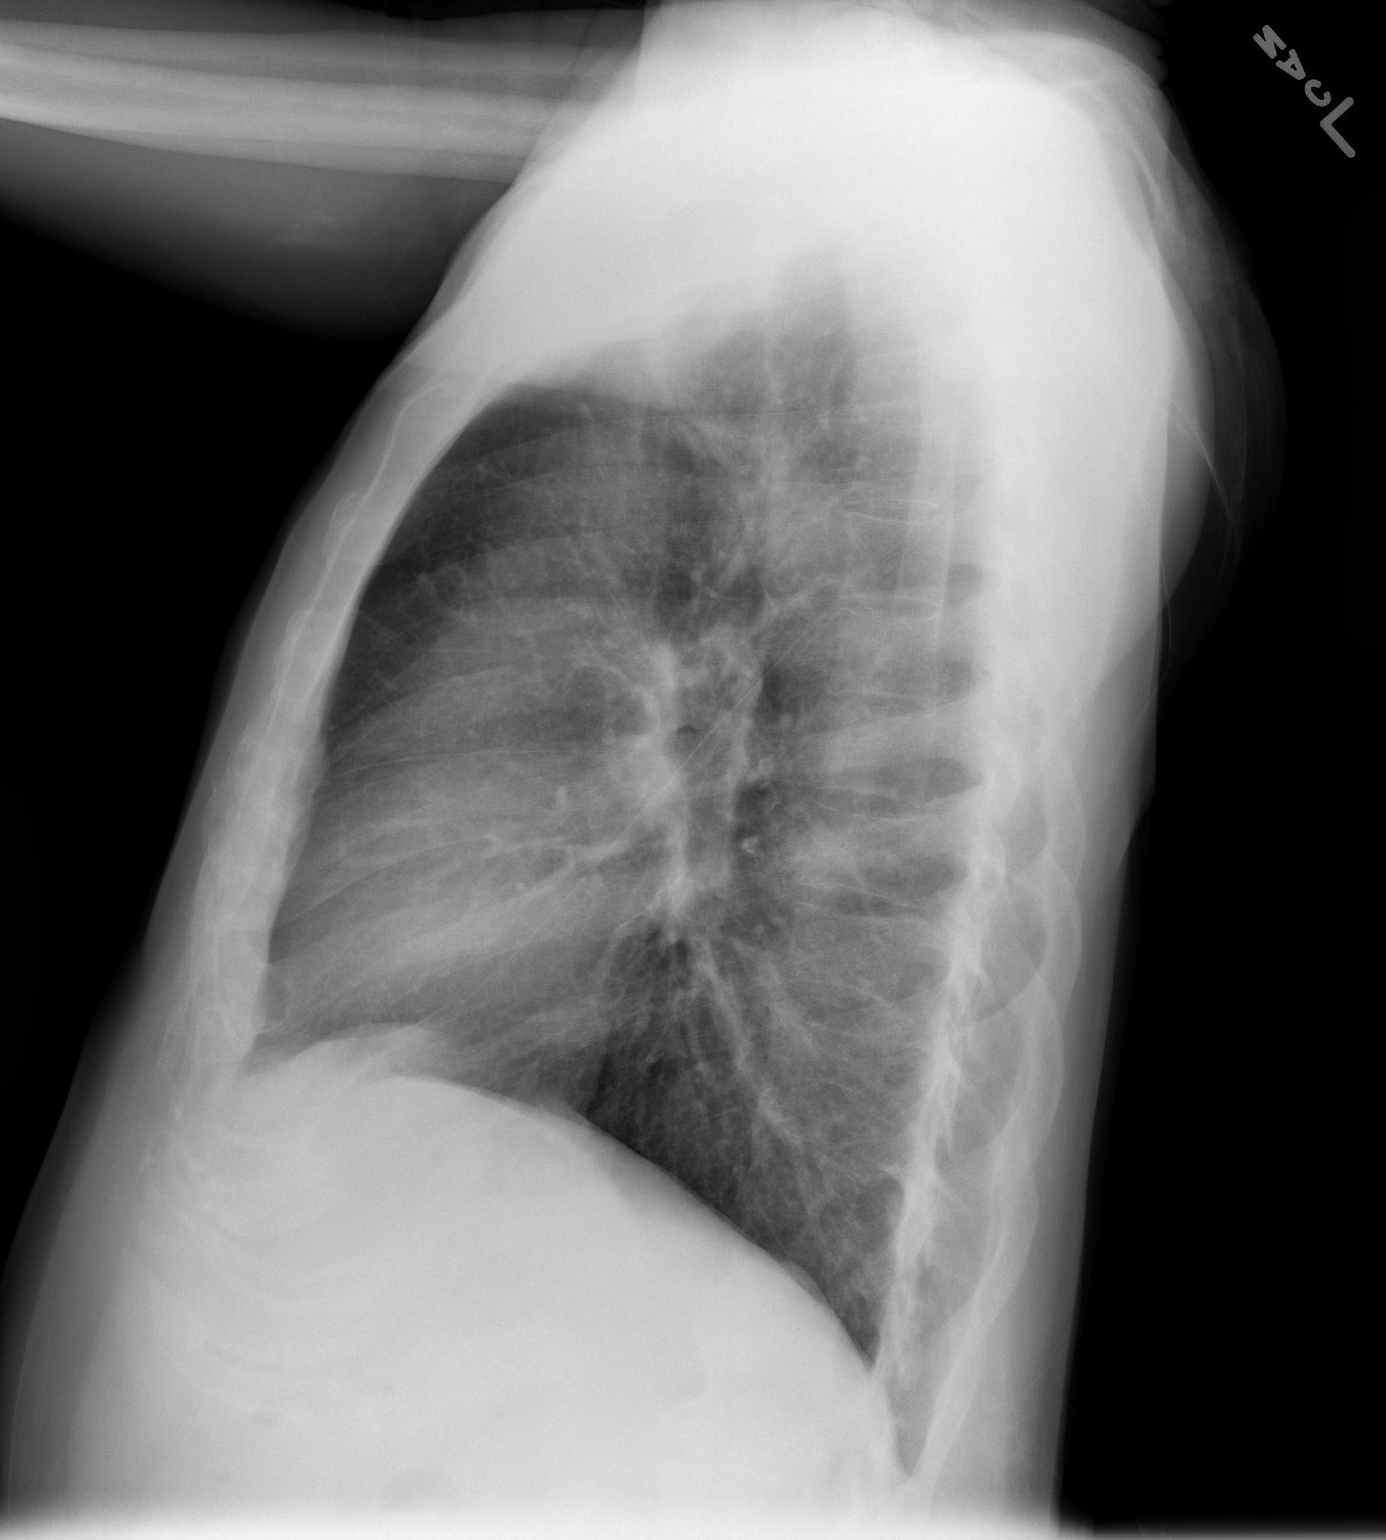

[2 of 2 positions shown; findings below may reference images not displayed]

FINDINGS: Normal heart size and pulmonary vascularity. Slight interstitial
fibrosis. No focal airspace disease or consolidation in the lungs.
No blunting of costophrenic angles. No pneumothorax.
IMPRESSION: No active cardiopulmonary disease.

## 2015-10-11 IMAGING — CT CT ABD-PELV W/O CM
2 of 4 series · 16 of 46 positions shown, 18 images · non-contrast
Comparison: None.

CLINICAL DATA: Diffuse abdominal pain since yesterday. Right kidney
was removed on [DATE]. Normal urination.

EXAM:
CT ABDOMEN AND PELVIS WITHOUT CONTRAST
TECHNIQUE: Multidetector CT imaging of the abdomen and pelvis was performed
following the standard protocol without IV contrast.

[Series 2: abd/pelvis 5.0 b31f · axial · 0.67mm/px · z∈[-585,-160]mm · 13 of 93 slices shown, 15 images]
[im 4/93  soft-tissue]
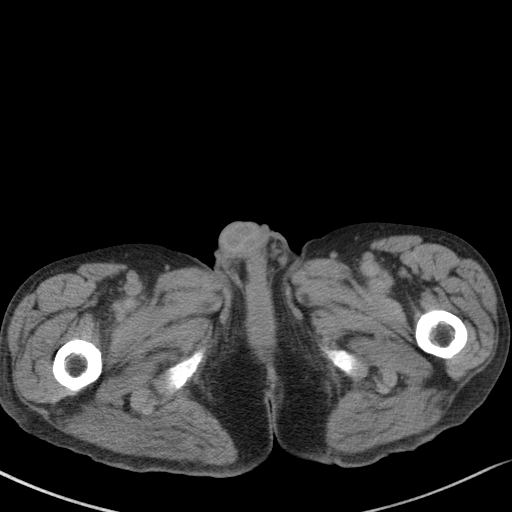
[im 4/93  bone]
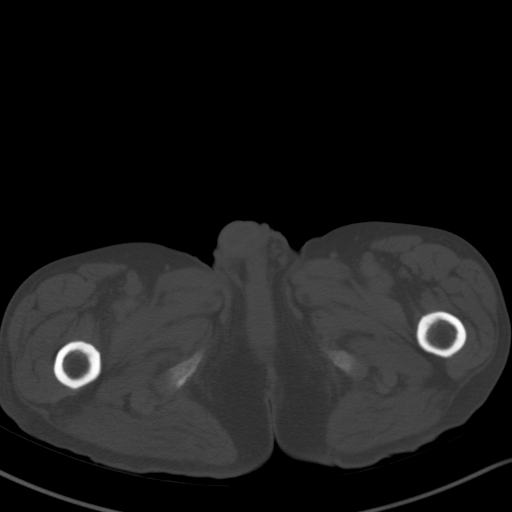
[im 12/93  soft-tissue]
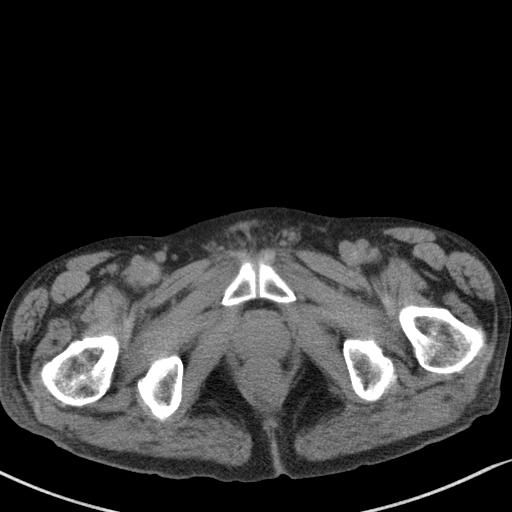
[im 19/93  soft-tissue]
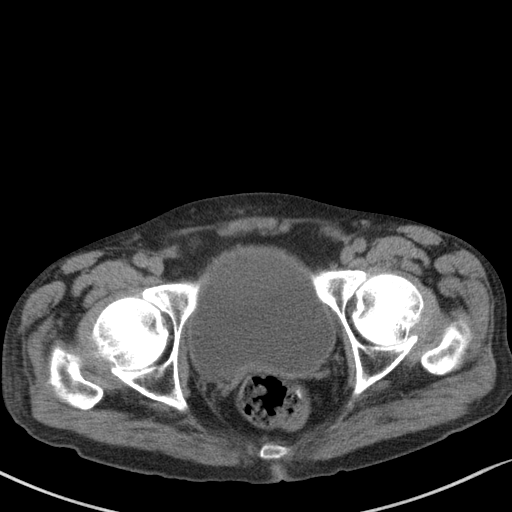
[im 26/93  soft-tissue]
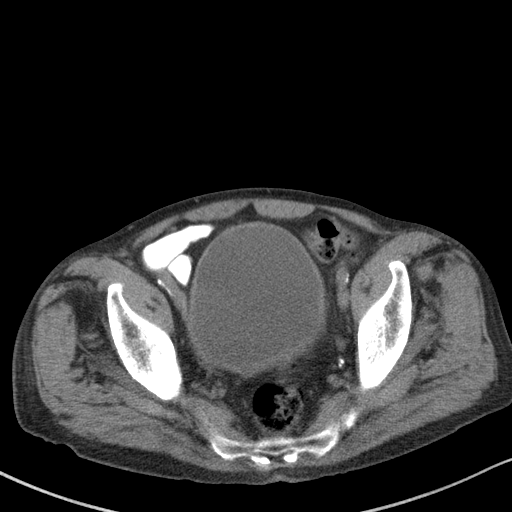
[im 34/93  soft-tissue]
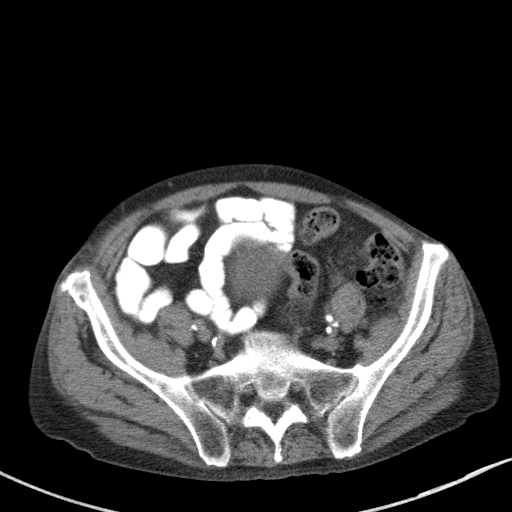
[im 41/93  soft-tissue]
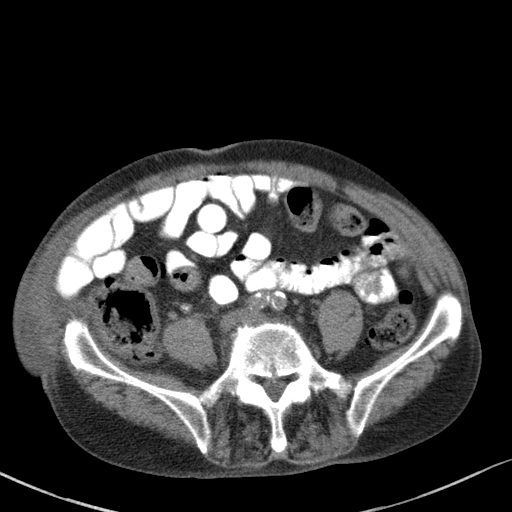
[im 48/93  soft-tissue]
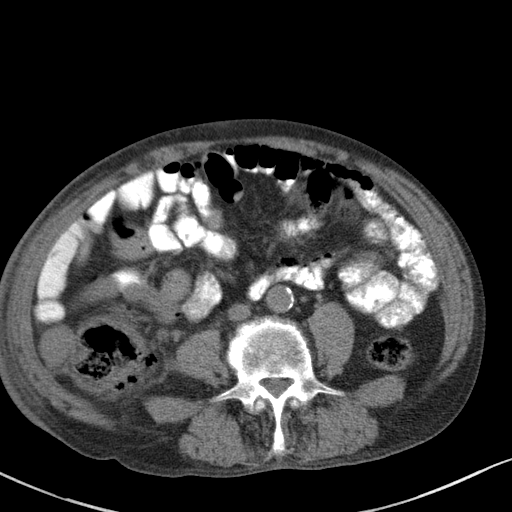
[im 52/93  soft-tissue]
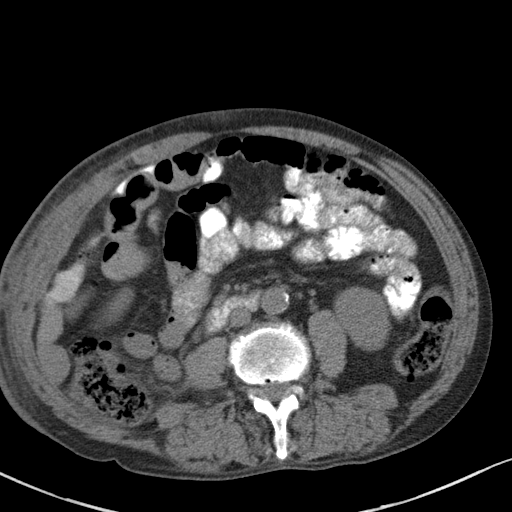
[im 59/93  soft-tissue]
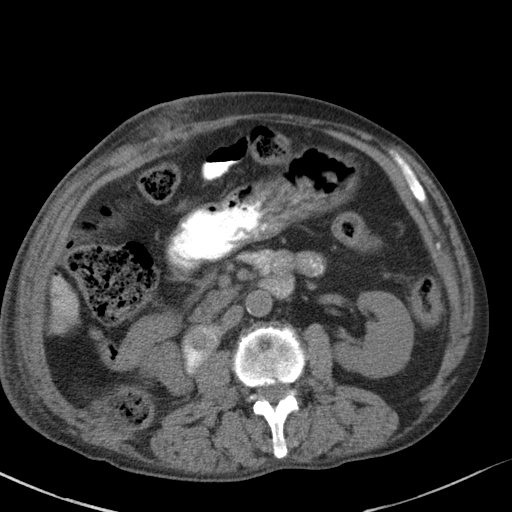
[im 59/93  bone]
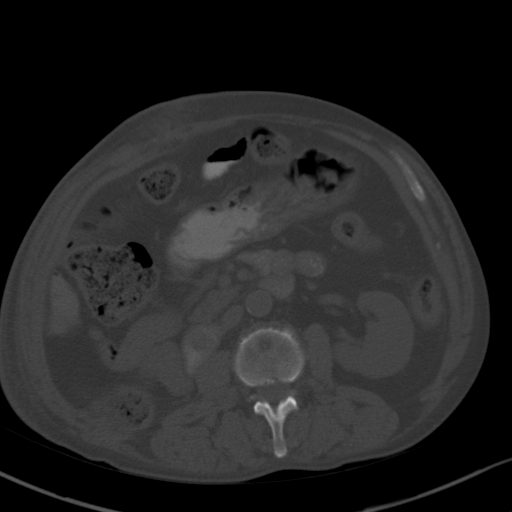
[im 67/93  soft-tissue]
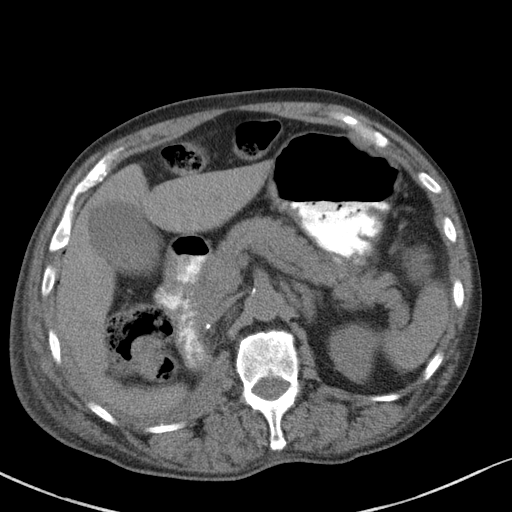
[im 74/93  soft-tissue]
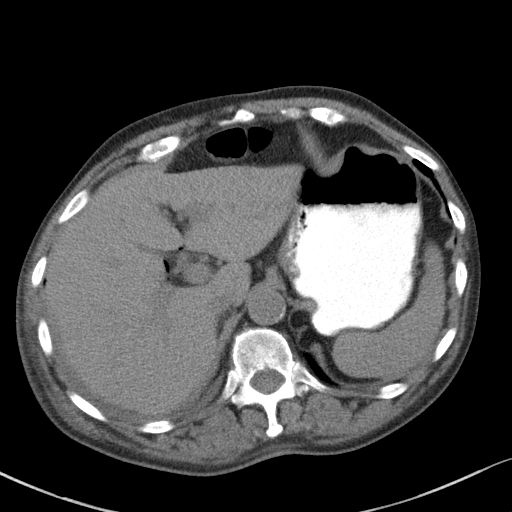
[im 81/93  soft-tissue]
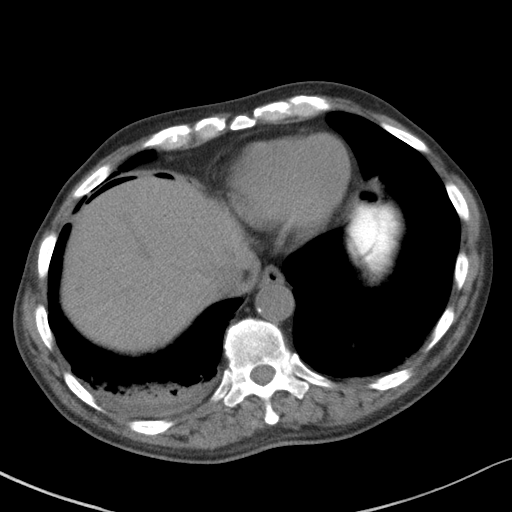
[im 89/93  soft-tissue]
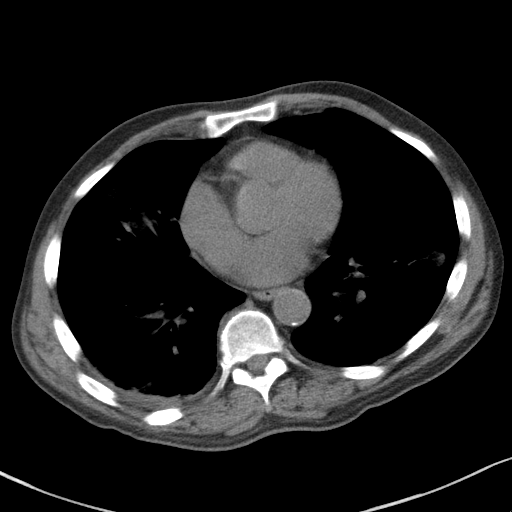

[Series 5: abd/pelvis 3.0 coronal · coronal · 0.72mm/px · 3 of 86 slices shown]
[im 29/86  soft-tissue]
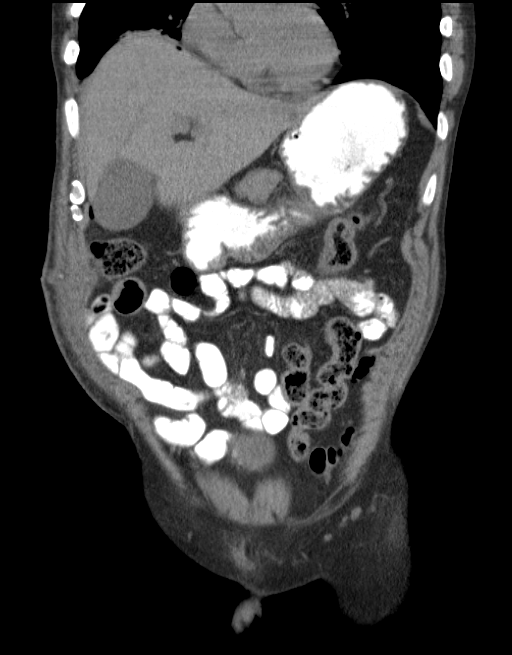
[im 38/86  soft-tissue]
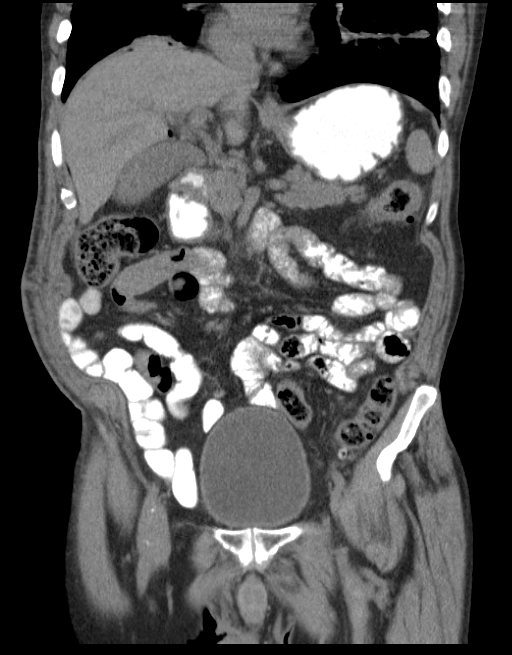
[im 48/86  soft-tissue]
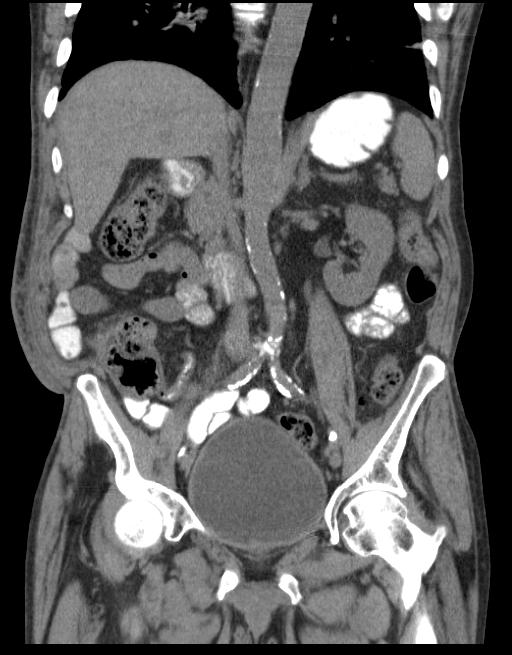

[16 of 46 positions shown; findings below may reference images not displayed]

FINDINGS: Atelectasis or infiltration in the lung bases. Small right pleural
effusion.

Small amount of free air in the abdomen may be residual from
surgery. No free fluid. The unenhanced appearance of the liver,
spleen, gallbladder, pancreas, adrenal glands, left kidney,
abdominal aorta, inferior vena cava, and retroperitoneal lymph nodes
is unremarkable. Surgical absence of the right kidney. Small amount
of infiltration in the right upper quadrant consistent with
postoperative changes. Stomach, small bowel, and colon are not
abnormally distended. Stool scattered throughout the colon.
Infiltration in the anterior abdominal wall consistent with recent
surgery.

Pelvis: Bladder wall is mildly thickened which could indicate
infection. Prostate gland is not enlarged. No pelvic mass or
lymphadenopathy. Appendix is normal. No diverticulitis. No free or
loculated pelvic fluid collections. Degenerative changes in the
spine. No destructive bone lesions appreciated.
IMPRESSION: Small amount of free air in the abdomen with infiltration in the
right upper quadrant posteriorly consistent with recent right
nephrectomy. Atelectasis in the right lung base with small effusion.
This is likely reactive. Bladder wall is mildly thickened, possibly
due to infection.

## 2016-07-06 ENCOUNTER — Encounter (HOSPITAL_BASED_OUTPATIENT_CLINIC_OR_DEPARTMENT_OTHER): Payer: Self-pay | Admitting: *Deleted

## 2016-07-06 ENCOUNTER — Emergency Department (HOSPITAL_BASED_OUTPATIENT_CLINIC_OR_DEPARTMENT_OTHER)
Admission: EM | Admit: 2016-07-06 | Discharge: 2016-07-06 | Disposition: A | Discharge status: Home / Self Care | Payer: Medicare HMO | Attending: Emergency Medicine | Admitting: Emergency Medicine

## 2016-07-06 DIAGNOSIS — R600 Localized edema: Secondary | ICD-10-CM | POA: Insufficient documentation

## 2016-07-06 DIAGNOSIS — M7989 Other specified soft tissue disorders: Secondary | ICD-10-CM | POA: Diagnosis present

## 2016-07-06 DIAGNOSIS — F17210 Nicotine dependence, cigarettes, uncomplicated: Secondary | ICD-10-CM | POA: Insufficient documentation

## 2016-07-06 DIAGNOSIS — R609 Edema, unspecified: Secondary | ICD-10-CM

## 2016-07-06 HISTORY — DX: Malignant (primary) neoplasm, unspecified: C80.1

## 2016-07-06 HISTORY — DX: Gastro-esophageal reflux disease without esophagitis: K21.9

## 2016-07-06 LAB — CBC WITH DIFFERENTIAL/PLATELET
BASOS ABS: 0 10*3/uL (ref 0.0–0.1)
Basophils Relative: 0 %
EOS PCT: 3 %
Eosinophils Absolute: 0.1 10*3/uL (ref 0.0–0.7)
HCT: 32.5 % — ABNORMAL LOW (ref 39.0–52.0)
Hemoglobin: 11 g/dL — ABNORMAL LOW (ref 13.0–17.0)
LYMPHS ABS: 2.2 10*3/uL (ref 0.7–4.0)
Lymphocytes Relative: 46 %
MCH: 32.2 pg (ref 26.0–34.0)
MCHC: 33.8 g/dL (ref 30.0–36.0)
MCV: 95 fL (ref 78.0–100.0)
MONO ABS: 0.8 10*3/uL (ref 0.1–1.0)
Monocytes Relative: 17 %
Neutro Abs: 1.6 10*3/uL — ABNORMAL LOW (ref 1.7–7.7)
Neutrophils Relative %: 34 %
Platelets: 219 10*3/uL (ref 150–400)
RBC: 3.42 MIL/uL — AB (ref 4.22–5.81)
RDW: 14.4 % (ref 11.5–15.5)
WBC: 4.6 10*3/uL (ref 4.0–10.5)

## 2016-07-06 LAB — COMPREHENSIVE METABOLIC PANEL
ALT: 11 U/L — AB (ref 17–63)
AST: 23 U/L (ref 15–41)
Albumin: 3.3 g/dL — ABNORMAL LOW (ref 3.5–5.0)
Alkaline Phosphatase: 60 U/L (ref 38–126)
Anion gap: 10 (ref 5–15)
BILIRUBIN TOTAL: 0.6 mg/dL (ref 0.3–1.2)
BUN: 13 mg/dL (ref 6–20)
CO2: 25 mmol/L (ref 22–32)
CREATININE: 1.67 mg/dL — AB (ref 0.61–1.24)
Calcium: 8.7 mg/dL — ABNORMAL LOW (ref 8.9–10.3)
Chloride: 102 mmol/L (ref 101–111)
GFR calc Af Amer: 45 mL/min — ABNORMAL LOW (ref >60)
GFR, EST NON AFRICAN AMERICAN: 39 mL/min — AB (ref >60)
Glucose, Bld: 88 mg/dL (ref 65–99)
Potassium: 4.2 mmol/L (ref 3.5–5.1)
Sodium: 137 mmol/L (ref 135–145)
TOTAL PROTEIN: 6.7 g/dL (ref 6.5–8.1)

## 2016-07-06 NOTE — ED Notes (Signed)
No questions regarding d/c, electric signature pad not working in room

## 2016-07-06 NOTE — Discharge Instructions (Signed)
Swelling in the lower legs is the number 1 side effect of the medicine you are taking for cancer treatment Beryle Flock).  Follow these instructions at home: Pay attention to any changes in your symptoms. Take these actions to help with your discomfort: Raise (elevate) your legs while you are sitting or lying down. Move around often to prevent stiffness and to lessen swelling. Do not sit or stand for long periods of time. Wear support stockings as told by your health care provider. Follow instructions from your health care provider about limiting salt (sodium) in your diet. Sometimes eating less salt can reduce swelling. Take over-the-counter and prescription medicines only as told by your health care provider. Your health care provider may prescribe medicine to help your body get rid of excess water (diuretic). Keep all follow-up visits as told by your health care provider. This is important. Contact a health care provider if: You have a fever. Your edema starts suddenly or is getting worse, especially if you are pregnant or have a medical condition. You have swelling in only one leg. You have increased swelling and pain in your legs. Get help right away if: You develop shortness of breath, especially when you are lying down. You have pain in your chest or abdomen. You feel weak. You faint.

## 2016-07-06 NOTE — ED Notes (Signed)
ED Provider at bedside for assessment.  

## 2016-07-06 NOTE — ED Provider Notes (Signed)
[facility] Provider Note   CSN: [id number] Arrival date & time: 07/06/16  2001  By signing my name below, I, Ricardo Harris, attest that this documentation has been prepared under the direction and in the presence of non-physician practitioner, Margarita Mail, PA-C. Electronically Signed: Jeanell Harris, Scribe. 07/06/2016. 9:49 PM.  History   Chief Complaint Chief Complaint  Patient presents with  . Leg Swelling   The history is provided by the patient. No language interpreter was used.   HPI Comments: Ricardo Harris is a 76 y.o. male who presents to the Emergency Department complaining of constant moderate BLE swelling that started a few days ago. He states he is currently on chemotherapy for renal cancer that has spread to other residual spots. No modifying factors. He reports associated tingling (feet). Denies any other complaints at this time.    PCP: Ardeth Sportsman, MD  Past Medical History:  Diagnosis Date  . Cancer ([place])   . GERD (gastroesophageal reflux disease)     There are no active problems to display for this patient.   Past Surgical History:  Procedure Laterality Date  . NEPHRECTOMY Right        Home Medications    Prior to Admission medications   Medication Sig Start Date End Date Taking? Authorizing Provider  pantoprazole (PROTONIX) 40 MG tablet Take 40 mg by mouth daily.   Yes Historical Provider, MD  potassium chloride SA (K-DUR,KLOR-CON) 20 MEQ tablet Take 20 mEq by mouth 2 (two) times daily.   Yes Historical Provider, MD  chlorproMAZINE (THORAZINE) 25 MG tablet Take 1 tablet (25 mg total) by mouth 3 (three) times daily. 11/16/13   Sherwood Gambler, MD  ciprofloxacin (CIPRO) 500 MG tablet Take 500 mg by mouth 2 (two) times daily.    Historical Provider, MD  dicyclomine (BENTYL) 20 MG tablet Take 1 tablet (20 mg total) by mouth 2 (two) times daily as needed for spasms. 01/26/14   Julianne Rice, MD  HYDROcodone-acetaminophen (NORCO/VICODIN) 5-325  MG per tablet Take 1 tablet by mouth every 6 (six) hours as needed for moderate pain.    Historical Provider, MD  polyethylene glycol (MIRALAX / GLYCOLAX) packet Take 17 g by mouth daily. 01/26/14   Julianne Rice, MD    Family History History reviewed. No pertinent family history.  Social History Social History  Substance Use Topics  . Smoking status: Current Every Day Smoker    Packs/day: 0.50    Types: Cigarettes  . Smokeless tobacco: Not on file  . Alcohol use Yes     Allergies   Patient has no known allergies.   Review of Systems Review of Systems All other systems reviewed and are negative for acute change except as noted in the HPI.  Physical Exam Updated Vital Signs BP (!) 141/49   Pulse 93   Temp 98.7 F (37.1 C)   Resp 16   Ht 5\' 6"  (1.676 m)   Wt 130 lb (59 kg)   SpO2 100%   BMI 20.98 kg/m   Physical Exam  Constitutional: He appears well-developed and well-nourished. No distress.  HENT:  Head: Normocephalic and atraumatic.  Eyes: Conjunctivae are normal.  Neck: Neck supple.  Cardiovascular: Normal rate.   Pulmonary/Chest: Effort normal.  Abdominal: Soft.  Musculoskeletal: Normal range of motion.  Bilateral +2 pitting edema from the mid calf down. Erythema and warmth. No TTP. No weeping. Normal bilateral pulses.   Neurological: He is alert.  Skin: Skin is warm and dry.  Psychiatric: He  has a normal mood and affect.  Nursing note and vitals reviewed.    ED Treatments / Results  DIAGNOSTIC STUDIES: Oxygen Saturation is 100% on RA, normal by my interpretation.    COORDINATION OF CARE: 9:53 PM- Pt advised of plan for treatment and pt agrees.  Labs (all labs ordered are listed, but only abnormal results are displayed) Labs Reviewed  COMPREHENSIVE METABOLIC PANEL - Abnormal; Notable for the following:       Result Value   Creatinine, Ser 1.67 (*)    Calcium 8.7 (*)    Albumin 3.3 (*)    ALT 11 (*)    GFR calc non Af Amer 39 (*)    GFR  calc Af Amer 45 (*)    All other components within normal limits  CBC WITH DIFFERENTIAL/PLATELET - Abnormal; Notable for the following:    RBC 3.42 (*)    Hemoglobin 11.0 (*)    HCT 32.5 (*)    Neutro Abs 1.6 (*)    All other components within normal limits    EKG  EKG Interpretation None       Radiology No results found.  Procedures Procedures (including critical care time)  Medications Ordered in ED Medications - No data to display     Initial Impression / Assessment and Plan / ED Course  I have reviewed the triage vital signs and the nursing notes.  Pertinent labs & imaging results that were available during my care of the patient were reviewed by me and considered in my medical decision making (see chart for details).     Patient with peripheral edema. There does appear to be some stasis dermatitis, but no signs of infection. I believe this is a side effect of the patient's chemotherapy agent, Beryle Flock as it is an adverse effect in 15% of users. Patients legs are equal and I highly doubt PE even in this patient with cancer given the distribution and symmetry of leg swelling. Patient is to wear compression stockings and f/u with pcp. Patient seen in shared visit with attending physician. Who agrees with assessment, work up , treatment, and plan for discharge.    Final Clinical Impressions(s) / ED Diagnoses   Final diagnoses:  Peripheral edema    New Prescriptions Discharge Medication List as of 07/06/2016 10:46 PM     I personally performed the services described in this documentation, which was scribed in my presence. The recorded information has been reviewed and is accurate.     Margarita Mail, PA-C 07/09/16 [place], MD 07/14/16 [phone]

## 2016-07-06 NOTE — ED Triage Notes (Signed)
Pt c/o bil leg swelling, pt is currently receiving chemo

## 2016-07-06 NOTE — ED Notes (Signed)
ED Provider at bedside. 

## 2019-11-12 DEATH — deceased
